# Patient Record
Sex: Female | Born: 1935 | Race: White | Hispanic: No | Smoking: Former smoker
Health system: Southern US, Community
[De-identification: ages and names within clinical notes are randomized; demographics above are authoritative.]

## PROBLEM LIST (undated history)

## (undated) DIAGNOSIS — K219 Gastro-esophageal reflux disease without esophagitis: Secondary | ICD-10-CM

## (undated) DIAGNOSIS — M810 Age-related osteoporosis without current pathological fracture: Secondary | ICD-10-CM

## (undated) DIAGNOSIS — E785 Hyperlipidemia, unspecified: Secondary | ICD-10-CM

## (undated) DIAGNOSIS — I10 Essential (primary) hypertension: Secondary | ICD-10-CM

## (undated) HISTORY — PX: TONSILLECTOMY AND ADENOIDECTOMY: SHX28

## (undated) HISTORY — DX: Gastro-esophageal reflux disease without esophagitis: K21.9

## (undated) HISTORY — PX: LAMINECTOMY: SHX219

## (undated) HISTORY — DX: Hyperlipidemia, unspecified: E78.5

## (undated) HISTORY — PX: BACK SURGERY: SHX140

## (undated) HISTORY — DX: Essential (primary) hypertension: I10

## (undated) HISTORY — DX: Age-related osteoporosis without current pathological fracture: M81.0

---

## 2002-07-27 LAB — HM COLONOSCOPY

## 2005-01-01 ENCOUNTER — Encounter: Admission: RE | Admit: 2005-01-01 | Discharge: 2005-01-01 | Payer: Self-pay | Admitting: Specialist

## 2005-02-06 ENCOUNTER — Ambulatory Visit: Payer: Self-pay | Admitting: Family Medicine

## 2005-08-16 ENCOUNTER — Inpatient Hospital Stay (HOSPITAL_COMMUNITY): Admission: RE | Admit: 2005-08-16 | Discharge: 2005-08-22 | Payer: Self-pay | Admitting: Orthopaedic Surgery

## 2005-08-17 ENCOUNTER — Ambulatory Visit: Payer: Self-pay | Admitting: Physical Medicine & Rehabilitation

## 2005-08-22 ENCOUNTER — Inpatient Hospital Stay (HOSPITAL_COMMUNITY)
Admission: RE | Admit: 2005-08-22 | Discharge: 2005-09-03 | Payer: Self-pay | Admitting: Physical Medicine & Rehabilitation

## 2010-04-09 HISTORY — PX: CORONARY ARTERY BYPASS GRAFT: SHX141

## 2010-04-20 ENCOUNTER — Inpatient Hospital Stay: Payer: Self-pay | Admitting: Internal Medicine

## 2010-06-12 ENCOUNTER — Encounter: Payer: Self-pay | Admitting: Cardiology

## 2010-07-09 ENCOUNTER — Encounter: Payer: Self-pay | Admitting: Cardiology

## 2010-08-08 ENCOUNTER — Encounter: Payer: Self-pay | Admitting: Cardiology

## 2012-02-04 IMAGING — CR DG CHEST 1V PORT
1 series · 1 of 1 positions shown · non-contrast
Comparison: none

REASON FOR EXAM: CP
COMMENTS:

PROCEDURE:     DXR - DXR PORTABLE CHEST SINGLE VIEW  - April 20, 2010  [DATE]
RESULT:     Comparison: None.

[view not recorded]
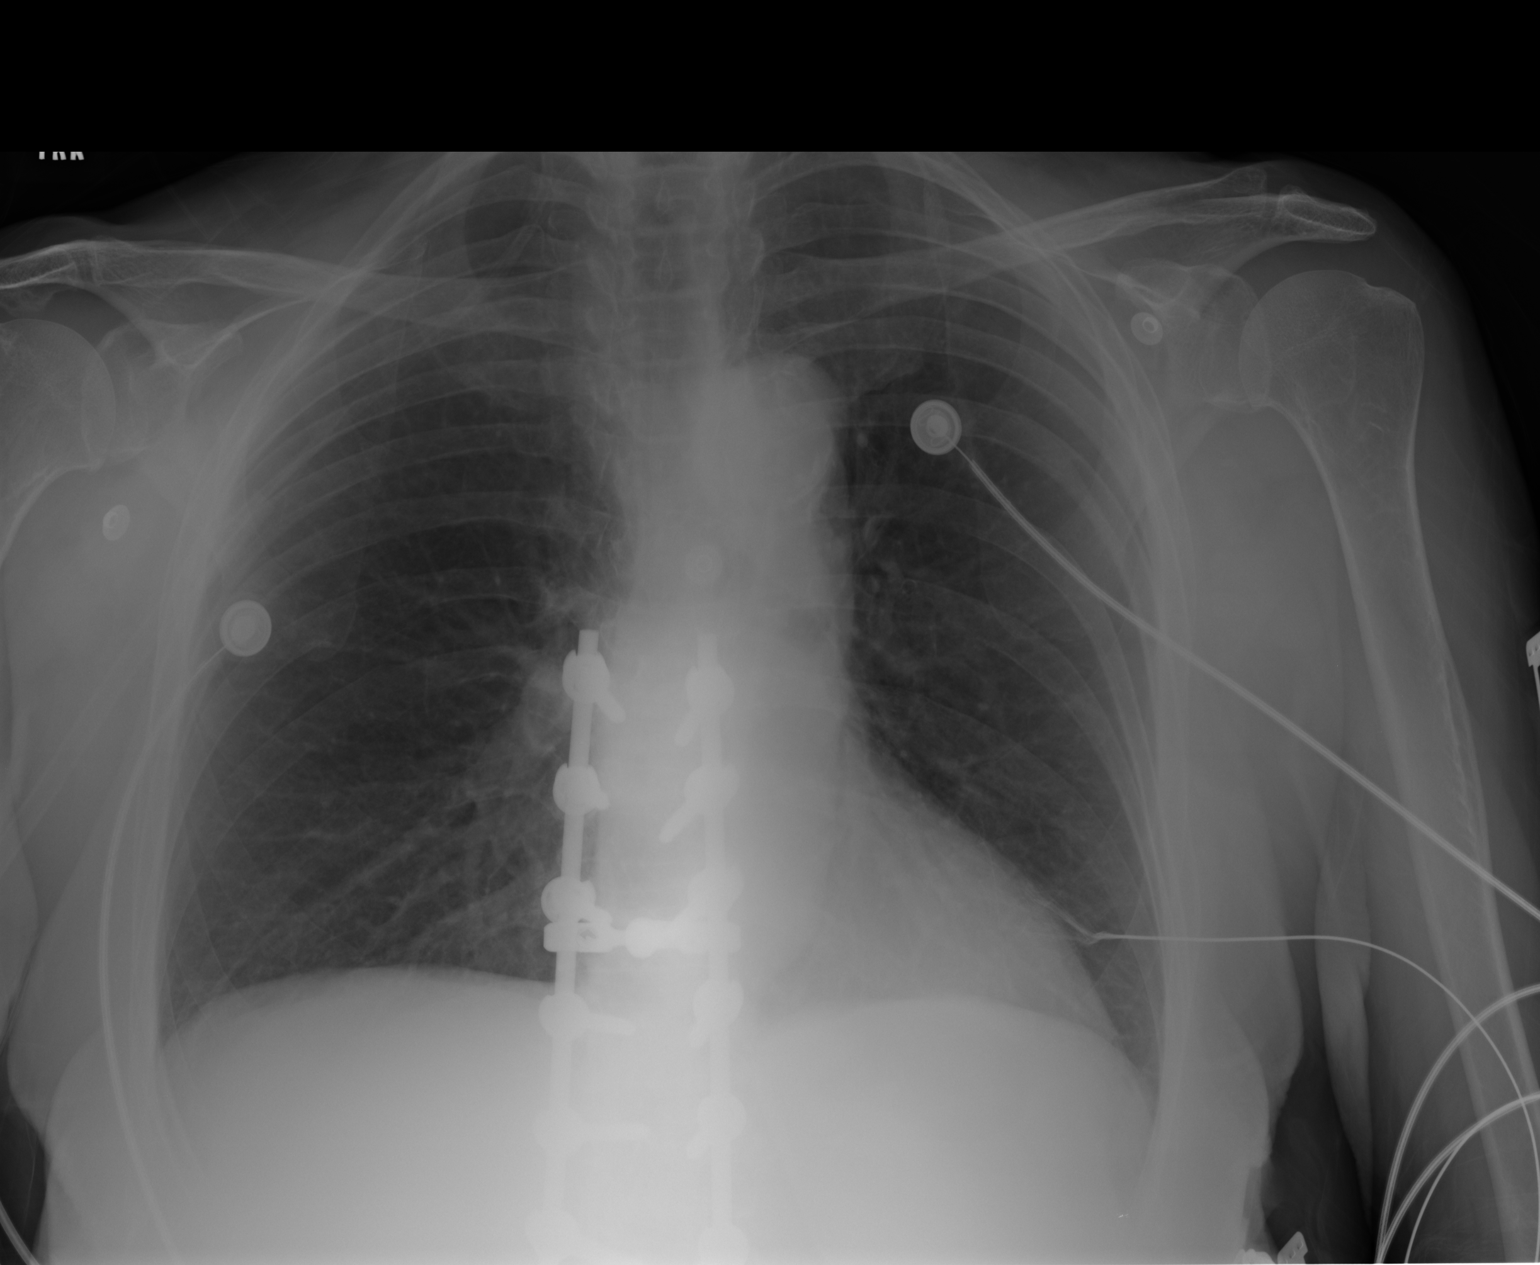

[1 of 1 positions shown; findings below may reference images not displayed]

FINDINGS: The heart size upper limits normal. Aorta mildly tortuous. No focal
parenchymal opacities. There is posterior spinal fusion hardware,
incompletely visualized.
IMPRESSION: No acute cardiopulmonary disease.

## 2014-09-10 ENCOUNTER — Telehealth: Payer: Self-pay | Admitting: Family Medicine

## 2014-09-10 NOTE — Telephone Encounter (Signed)
Scheduled pt for 10:45 on Tuesday 10/12/14. Thanks TNP

## 2014-09-10 NOTE — Telephone Encounter (Signed)
Pt would like to be reestablished because her cardio doctor said he no longer needed to continue seeing pt she could return to PCP. Metoprolol 50 mg twice a day &  Lisinopril 5 mg. Insurance: Social research officer, governmentMedicare & BCBS. Can we reestablish pt and when can I schedule pt? Next available CPE Betsey HolidaySlot is in September. Thanks TNP

## 2014-09-10 NOTE — Telephone Encounter (Signed)
OK to work in 45 minute slot somewhere.  Thanks.

## 2014-09-22 DIAGNOSIS — I10 Essential (primary) hypertension: Secondary | ICD-10-CM | POA: Insufficient documentation

## 2014-09-22 DIAGNOSIS — E7800 Pure hypercholesterolemia, unspecified: Secondary | ICD-10-CM | POA: Insufficient documentation

## 2014-09-22 DIAGNOSIS — I454 Nonspecific intraventricular block: Secondary | ICD-10-CM | POA: Insufficient documentation

## 2014-09-22 DIAGNOSIS — K219 Gastro-esophageal reflux disease without esophagitis: Secondary | ICD-10-CM | POA: Insufficient documentation

## 2014-09-22 DIAGNOSIS — K5730 Diverticulosis of large intestine without perforation or abscess without bleeding: Secondary | ICD-10-CM | POA: Insufficient documentation

## 2014-09-22 DIAGNOSIS — M810 Age-related osteoporosis without current pathological fracture: Secondary | ICD-10-CM | POA: Insufficient documentation

## 2014-09-22 DIAGNOSIS — R9431 Abnormal electrocardiogram [ECG] [EKG]: Secondary | ICD-10-CM | POA: Insufficient documentation

## 2014-10-12 ENCOUNTER — Other Ambulatory Visit: Payer: Self-pay

## 2014-10-12 ENCOUNTER — Ambulatory Visit (INDEPENDENT_AMBULATORY_CARE_PROVIDER_SITE_OTHER): Payer: Medicare Other | Admitting: Family Medicine

## 2014-10-12 ENCOUNTER — Encounter: Payer: Self-pay | Admitting: Family Medicine

## 2014-10-12 VITALS — BP 152/80 | HR 76 | Temp 97.9°F | Resp 16 | Ht 63.0 in | Wt 162.0 lb

## 2014-10-12 DIAGNOSIS — M545 Low back pain, unspecified: Secondary | ICD-10-CM | POA: Insufficient documentation

## 2014-10-12 DIAGNOSIS — M5442 Lumbago with sciatica, left side: Secondary | ICD-10-CM | POA: Diagnosis not present

## 2014-10-12 DIAGNOSIS — E785 Hyperlipidemia, unspecified: Secondary | ICD-10-CM

## 2014-10-12 DIAGNOSIS — Z23 Encounter for immunization: Secondary | ICD-10-CM | POA: Diagnosis not present

## 2014-10-12 DIAGNOSIS — I10 Essential (primary) hypertension: Secondary | ICD-10-CM

## 2014-10-12 DIAGNOSIS — Z789 Other specified health status: Secondary | ICD-10-CM | POA: Insufficient documentation

## 2014-10-12 DIAGNOSIS — I2510 Atherosclerotic heart disease of native coronary artery without angina pectoris: Secondary | ICD-10-CM | POA: Insufficient documentation

## 2014-10-12 DIAGNOSIS — M5441 Lumbago with sciatica, right side: Secondary | ICD-10-CM

## 2014-10-12 DIAGNOSIS — M810 Age-related osteoporosis without current pathological fracture: Secondary | ICD-10-CM | POA: Diagnosis not present

## 2014-10-12 NOTE — Progress Notes (Signed)
Subjective:    Patient ID: Jenny Hill, female    DOB: 30-Mar-1935, 80 y.o.   MRN: [id number]  Hypertension This is a chronic problem. The problem is unchanged. The problem is controlled. Associated symptoms include headaches, malaise/fatigue, palpitations (occasionally), peripheral edema (secondary to CABG) and shortness of breath. Pertinent negatives include no anxiety, blurred vision, chest pain, neck pain, orthopnea or sweats. Treatments tried: Lisinopril, Metoprolol. The current treatment provides significant improvement.  Hyperlipidemia Associated symptoms include myalgias and shortness of breath. Pertinent negatives include no chest pain, focal sensory loss, focal weakness or leg pain. Current antihyperlipidemic treatment includes statins (Atorvastatin).     Review of Systems  Constitutional: Positive for malaise/fatigue.  Eyes: Negative for blurred vision.  Respiratory: Positive for shortness of breath.   Cardiovascular: Positive for palpitations (occasionally). Negative for chest pain and orthopnea.  Musculoskeletal: Positive for myalgias. Negative for neck pain.  Neurological: Positive for headaches. Negative for focal weakness.   BP 152/80 mmHg  Pulse 76  Temp(Src) 97.9 F (36.6 C) (Oral)  Resp 16  Ht 5\' 3"  (1.6 m)  Wt 162 lb (73.483 kg)  BMI 28.70 kg/m2   Patient Active Problem List   Diagnosis Date Noted  . Arteriosclerosis of coronary artery 10/12/2014  . HLD (hyperlipidemia) 10/12/2014  . Drug intolerance 10/12/2014  . Abnormal electrocardiogram 09/22/2014  . Bundle branch block 09/22/2014  . Acid reflux 09/22/2014  . Diverticulosis of colon 09/22/2014  . Pure hypercholesterolemia 09/22/2014  . BP (high blood pressure) 09/22/2014  . OP (osteoporosis) 09/22/2014   Past Medical History  Diagnosis Date  . Hypertension   . Hyperlipidemia   . GERD (gastroesophageal reflux disease)   . Osteoporosis    Current Outpatient Prescriptions on File Prior  to Visit  Medication Sig  . aspirin 81 MG tablet Take by mouth.  Marland Kitchen. BIOFLAVONOID PRODUCTS PO Take by mouth.  . Calcium Carbonate-Vitamin D (CALTRATE 600+D) 600-400 MG-UNIT per tablet Take by mouth.  . metoprolol tartrate (LOPRESSOR) 25 MG tablet Take by mouth.  . Niacin, Antihyperlipidemic, 500 MG TABS Take by mouth.  . lansoprazole (PREVACID) 30 MG capsule Take by mouth.  . Plant Sterols and Stanols 450 MG CAPS Take by mouth.  . Probiotic CAPS Take by mouth.   No current facility-administered medications on file prior to visit.   No Known Allergies Past Surgical History  Procedure Laterality Date  . Laminectomy      C3-S1  . Tonsillectomy and adenoidectomy      childhood  . Back surgery      T10-S1  . Coronary artery bypass graft  2012    triple   History   Social History  . Marital Status: Widowed    Spouse Name: N/A  . Number of Children: N/A  . Years of Education: N/A   Occupational History  . Not on file.   Social History Main Topics  . Smoking status: Former Smoker -- 1.00 packs/day for 52 years    Quit date: 04/08/2000  . Smokeless tobacco: Never Used  . Alcohol Use: No  . Drug Use: No  . Sexual Activity: Not on file   Other Topics Concern  . Not on file   Social History Narrative   Family History  Problem Relation Age of Onset  . Heart disease Brother 3358    3 MI        Objective:   Physical Exam  Constitutional: She is oriented to person, place, and time. She appears well-developed and well-nourished.  Cardiovascular: Normal rate and regular rhythm.   Pulmonary/Chest: Effort normal and breath sounds normal.  Neurological: She is alert and oriented to person, place, and time.  Psychiatric: She has a normal mood and affect. Her behavior is normal. Judgment and thought content normal.   BP 152/80 mmHg  Pulse 76  Temp(Src) 97.9 F (36.6 C) (Oral)  Resp 16  Ht  (1.6 m)  Wt 162 lb (73.483 kg)  BMI 28.70 kg/m2     Assessment & Plan:   1.  HLD (hyperlipidemia) Stable. Continue current medication and plan of care. Will check labs. Has CAD and has not been back to see cardiology. Also talked about her living will and gave her [employer] DNR, with no expiration date for her refrigerator. Patent clear about what it means. Does not want CPR or shocks.  - Lipid panel - Comprehensive metabolic panel  2. OP (osteoporosis) Will check Vitamin D.  - Vit D  25 hydroxy (rtn osteoporosis monitoring)  3. Essential hypertension Stable. Continue current medication.  - CBC with Differential/Platelet - TSH  4. Bilateral low back pain with sciatica, sciatica laterality unspecified Worsening. Will refer back to PT to evaluate and treat.  - Ambulatory referral to Physical Therapy  Lorie Phenix, MD

## 2014-10-13 ENCOUNTER — Telehealth: Payer: Self-pay

## 2014-10-13 LAB — CBC WITH DIFFERENTIAL/PLATELET
BASOS ABS: 0.1 10*3/uL (ref 0.0–0.2)
BASOS: 1 %
EOS (ABSOLUTE): 0.1 10*3/uL (ref 0.0–0.4)
Eos: 1 %
HEMATOCRIT: 37.9 % (ref 34.0–46.6)
HEMOGLOBIN: 12.2 g/dL (ref 11.1–15.9)
IMMATURE GRANS (ABS): 0 10*3/uL (ref 0.0–0.1)
Immature Granulocytes: 0 %
LYMPHS: 34 %
Lymphocytes Absolute: 2.1 10*3/uL (ref 0.7–3.1)
MCH: 27.9 pg (ref 26.6–33.0)
MCHC: 32.2 g/dL (ref 31.5–35.7)
MCV: 87 fL (ref 79–97)
MONOCYTES: 8 %
Monocytes Absolute: 0.5 10*3/uL (ref 0.1–0.9)
NEUTROS ABS: 3.5 10*3/uL (ref 1.4–7.0)
NEUTROS PCT: 56 %
Platelets: 257 10*3/uL (ref 150–379)
RBC: 4.37 x10E6/uL (ref 3.77–5.28)
RDW: 13.9 % (ref 12.3–15.4)
WBC: 6.2 10*3/uL (ref 3.4–10.8)

## 2014-10-13 LAB — COMPREHENSIVE METABOLIC PANEL
A/G RATIO: 1.6 (ref 1.1–2.5)
ALK PHOS: 69 IU/L (ref 39–117)
ALT: 15 IU/L (ref 0–32)
AST: 25 IU/L (ref 0–40)
Albumin: 4.7 g/dL (ref 3.5–4.8)
BILIRUBIN TOTAL: 0.9 mg/dL (ref 0.0–1.2)
BUN / CREAT RATIO: 16 (ref 11–26)
BUN: 16 mg/dL (ref 8–27)
CHLORIDE: 103 mmol/L (ref 97–108)
CO2: 19 mmol/L (ref 18–29)
Calcium: 9.9 mg/dL (ref 8.7–10.3)
Creatinine, Ser: 0.99 mg/dL (ref 0.57–1.00)
GFR, EST AFRICAN AMERICAN: 63 mL/min/{1.73_m2} (ref >59)
GFR, EST NON AFRICAN AMERICAN: 55 mL/min/{1.73_m2} — AB (ref >59)
Globulin, Total: 3 g/dL (ref 1.5–4.5)
Glucose: 99 mg/dL (ref 65–99)
POTASSIUM: 4.4 mmol/L (ref 3.5–5.2)
SODIUM: 144 mmol/L (ref 134–144)
TOTAL PROTEIN: 7.7 g/dL (ref 6.0–8.5)

## 2014-10-13 LAB — LIPID PANEL
CHOL/HDL RATIO: 3.3 ratio (ref 0.0–4.4)
Cholesterol, Total: 221 mg/dL — ABNORMAL HIGH (ref 100–199)
HDL: 66 mg/dL (ref >39)
LDL CALC: 99 mg/dL (ref 0–99)
Triglycerides: 281 mg/dL — ABNORMAL HIGH (ref 0–149)
VLDL Cholesterol Cal: 56 mg/dL — ABNORMAL HIGH (ref 5–40)

## 2014-10-13 LAB — TSH: TSH: 1.25 u[IU]/mL (ref 0.450–4.500)

## 2014-10-13 LAB — VITAMIN D 25 HYDROXY (VIT D DEFICIENCY, FRACTURES): VIT D 25 HYDROXY: 41.5 ng/mL (ref 30.0–100.0)

## 2014-10-13 NOTE — Telephone Encounter (Signed)
LMTCB Emily Drozdowski, CMA  

## 2014-10-13 NOTE — Telephone Encounter (Signed)
-----   Message from Lorie PhenixNancy Maloney, MD sent at 10/13/2014  4:04 PM EDT ----- Labs stable. Please notify patient. Thanks.

## 2014-10-14 NOTE — Telephone Encounter (Signed)
Advised pt of lab results. Pt verbally acknowledges understanding. Emily Drozdowski, CMA   

## 2014-10-26 ENCOUNTER — Ambulatory Visit: Payer: Medicare Other | Attending: Family Medicine | Admitting: Physical Therapy

## 2014-10-26 ENCOUNTER — Encounter: Payer: Self-pay | Admitting: Physical Therapy

## 2014-10-26 DIAGNOSIS — R531 Weakness: Secondary | ICD-10-CM | POA: Insufficient documentation

## 2014-10-26 DIAGNOSIS — R262 Difficulty in walking, not elsewhere classified: Secondary | ICD-10-CM | POA: Diagnosis not present

## 2014-10-26 NOTE — Therapy (Signed)
[place] [facility] MAIN [facility] [address] [place], [place], [address] Phone: [phone]   Fax:  [phone]  Physical Therapy Evaluation  Patient Details  Name: Jenny RakeClementine L Hill MRN: [id number] Date of Birth: 1934/06/06 Referring Provider:  Lorie PhenixMaloney, Nancy, MD  Encounter Date: 10/26/2014      PT End of Session - 10/26/14 1708    Visit Number 1   Number of Visits 13   PT Start Time 0430   PT Stop Time 0530   PT Time Calculation (min) 60 min   Equipment Utilized During Treatment Gait belt   Activity Tolerance Patient tolerated treatment well   Behavior During Therapy [facility] for tasks assessed/performed      Past Medical History  Diagnosis Date  . Hypertension   . Hyperlipidemia   . GERD (gastroesophageal reflux disease)   . Osteoporosis     Past Surgical History  Procedure Laterality Date  . Laminectomy      C3-S1  . Tonsillectomy and adenoidectomy      childhood  . Back surgery      T10-S1  . Coronary artery bypass graft  2012    triple    There were no vitals filed for this visit.  Visit Diagnosis:  Weakness - Plan: PT plan of care cert/re-cert  Difficulty walking - Plan: PT plan of care cert/re-cert      Subjective Assessment - 10/26/14 1640    Subjective Patient wants to get her core stronger.    Currently in Pain? No/denies            [facility] - 10/26/14 1640    Assessment   Medical Diagnosis LBP   Onset Date/Surgical Date 08/07/05   Hand Dominance Right   Next MD Visit 01/08/15   Prior Therapy yes   Balance Screen   Has the patient fallen in the past 6 months Yes   How many times? 2   Has the patient had a decrease in activity level because of a fear of falling?  Yes   Is the patient reluctant to leave their home because of a fear of falling?  No   Home Nurse, mental healthnvironment   Living Environment Private residence   Living Arrangements Alone   Available Help at Discharge Neighbor   Type of  Home House   Home Access Stairs to enter   Entrance Stairs-Number of Steps 2   Entrance Stairs-Rails Right   Home Layout One level   Home Equipment Walker - 4 wheels;Cane - single point;Grab bars - tub/shower;Tub bench   Prior Function   Level of Independence Independent       Eval: Sensation is intact Strength BUE WNL Strength BLE hip flex 3/5, abd 2+/5, extension 2+/5 , knee flex and ext 5/5, ankle WNL Ambulation with RW outdoors, using UE and furniture indoors, flexed trunk Outcome measures: 5 x sit to stand 26.12 sec TUG 18.62 sec 10 MW .66 m/sec 6 MW 450 feet   Therapeutic exericses: Hooklying TA with hip abd/ER x 20 GTB Hooklying hip flex/ext x 10 Hooklying marching with TA x 20  Leg press x 75 lbs x 20 x 2                        PT Long Term Goals - 10/26/14 1712    PT LONG TERM GOAL #1   Title Patient will reduce timed up and go to <11 seconds to reduce fall risk and demonstrate  improved transfer/gait ability10/11/16   PT LONG TERM GOAL #2   Title Patient will increase six minute walk test distance to >1000 for progression to community ambulator and improve gait ability10/11/16   PT LONG TERM GOAL #3   Title Patient (> 105 years old) will complete five times sit to stand test in < 15 seconds indicating an increased LE strength and improved balance10/11/16               Plan - 11-01-2014 1708    Clinical Impression Statement Patient is 80 year old female with weakness in BLE hips and core and decreased ambulation. She has decreased gait speed and increased fall risk.    Pt will benefit from skilled therapeutic intervention in order to improve on the following deficits Abnormal gait;Decreased strength;Decreased mobility;Difficulty walking   Rehab Potential Fair   PT Frequency 1x / week   PT Duration 12 weeks   PT Treatment/Interventions Aquatic Therapy;Cryotherapy;Electrical Stimulation;Moist Heat;Balance training;Therapeutic  exercise;Therapeutic activities;Gait training   PT Next Visit Plan strengthening progression   Consulted and Agree with Plan of Care Patient          G-Codes - 01-Nov-2014 1734    Functional Assessment Tool Used TUG, 5 x sit to stand , 6 MW 10 MW   Functional Limitation Mobility: Walking and moving around   Mobility: Walking and Moving Around Current Status [phone]) At least 40 percent but less than 60 percent impaired, limited or restricted   Mobility: Walking and Moving Around Goal Status [phone]) At least 20 percent but less than 40 percent impaired, limited or restricted       Problem List Patient Active Problem List   Diagnosis Date Noted  . Arteriosclerosis of coronary artery 10/12/2014  . HLD (hyperlipidemia) 10/12/2014  . Drug intolerance 10/12/2014  . Low back pain 10/12/2014  . Abnormal electrocardiogram 09/22/2014  . Bundle branch block 09/22/2014  . Acid reflux 09/22/2014  . Diverticulosis of colon 09/22/2014  . Pure hypercholesterolemia 09/22/2014  . BP (high blood pressure) 09/22/2014  . OP (osteoporosis) 09/22/2014    [name] Nov 01, 2014, 5:35 PM  [place] [facility] MAIN [facility] SERVICES [address] [place], [place], [address] Phone: [phone]   Fax:  [phone]

## 2014-11-01 ENCOUNTER — Ambulatory Visit: Payer: Medicare Other | Admitting: Physical Therapy

## 2014-11-01 DIAGNOSIS — R531 Weakness: Secondary | ICD-10-CM

## 2014-11-01 DIAGNOSIS — R262 Difficulty in walking, not elsewhere classified: Secondary | ICD-10-CM

## 2014-11-01 NOTE — Therapy (Signed)
[place] [facility] MAIN [facility] SERVICES [address] [place], [place], [address] Phone: [phone]   Fax:  [phone]  Physical Therapy Treatment  Patient Details  Name: Jenny Hill MRN: [id number] Date of Birth: 1934-07-30 Referring Provider:  Lorie Phenix, MD  Encounter Date: 11/01/2014    Past Medical History  Diagnosis Date  . Hypertension   . Hyperlipidemia   . GERD (gastroesophageal reflux disease)   . Osteoporosis     Past Surgical History  Procedure Laterality Date  . Laminectomy      C3-S1  . Tonsillectomy and adenoidectomy      childhood  . Back surgery      T10-S1  . Coronary artery bypass graft  2012    triple    There were no vitals filed for this visit.  Visit Diagnosis:  No diagnosis found.      Subjective Assessment - 11/01/14 1343    Subjective Pt reports she has been compliant with her HEP.  She states that her R LE is more weak than her L LE, but she is uncertain why.   Currently in Pain? No/denies       Therapeutic Exercises performed as follows: Seated alt. Hip Flexion with 2# ankle wts 2x10; LAQs with 2# B 2x10; HS curls with green Tband 2x10 B; Hooklying hip abd/ER with green tband and TA squeeze 2x10; Bridging with TA squeeze 2x10; SL'ing clams B with 2# on lateral hips 2x10; stdg heel/toe raises 2x10. Leg Press Machine 20x 75# leg press and 20x heel raises with 75#. Sit to Stands from large blue plinth with no hands 2x10. Ambulation on TM at 0.8 mph x 2 min with verbal cueing to not drag R foot and for proper upright posture with ambulation.       Assessment:  Pt exhibited significant core/trunk weakness with bridging exercise today.  She also exhibited R LE weakness with gait and standing ther ex's.      Plan:  Continue to work on core and LE strengthening as tolerated at next visit.                PT Long Term Goals - 10/26/14 1712    PT LONG TERM GOAL #1   Title  Patient will reduce timed up and go to <11 seconds to reduce fall risk and demonstrate improved transfer/gait ability10/11/16   PT LONG TERM GOAL #2   Title Patient will increase six minute walk test distance to >1000 for progression to community ambulator and improve gait ability10/11/16   PT LONG TERM GOAL #3   Title Patient (> 47 years old) will complete five times sit to stand test in < 15 seconds indicating an increased LE strength and improved balance10/11/16               Problem List Patient Active Problem List   Diagnosis Date Noted  . Arteriosclerosis of coronary artery 10/12/2014  . HLD (hyperlipidemia) 10/12/2014  . Drug intolerance 10/12/2014  . Low back pain 10/12/2014  . Abnormal electrocardiogram 09/22/2014  . Bundle branch block 09/22/2014  . Acid reflux 09/22/2014  . Diverticulosis of colon 09/22/2014  . Pure hypercholesterolemia 09/22/2014  . BP (high blood pressure) 09/22/2014  . OP (osteoporosis) 09/22/2014    Michalene Debruler, MPT 11/01/2014, 1:44 PM  [place] [facility] MAIN [facility] SERVICES [address] [place], [place], [address] Phone: [phone]   Fax:  [phone]

## 2014-11-03 ENCOUNTER — Encounter: Payer: Medicare Other | Admitting: Physical Therapy

## 2014-11-08 ENCOUNTER — Encounter: Payer: Self-pay | Admitting: Physical Therapy

## 2014-11-08 ENCOUNTER — Ambulatory Visit: Payer: Medicare Other | Attending: Family Medicine | Admitting: Physical Therapy

## 2014-11-08 DIAGNOSIS — R262 Difficulty in walking, not elsewhere classified: Secondary | ICD-10-CM | POA: Diagnosis present

## 2014-11-08 DIAGNOSIS — R531 Weakness: Secondary | ICD-10-CM | POA: Insufficient documentation

## 2014-11-08 NOTE — Therapy (Signed)
[place] [facility] MAIN [facility] SERVICES [address] [place], [place], [address] Phone: [phone]   Fax:  [phone]  Physical Therapy Treatment  Patient Details  Name: Jenny Hill MRN: [id number] Date of Birth: 1934/10/20 Referring Provider:  Lorie Phenix, MD  Encounter Date: 11/08/2014      PT End of Session - 11/08/14 1309    Visit Number 2   Number of Visits 13   PT Start Time 0100   PT Stop Time 0145   PT Time Calculation (min) 45 min   Equipment Utilized During Treatment Gait belt   Activity Tolerance Patient tolerated treatment well   Behavior During Therapy [facility] for tasks assessed/performed      Past Medical History  Diagnosis Date  . Hypertension   . Hyperlipidemia   . GERD (gastroesophageal reflux disease)   . Osteoporosis     Past Surgical History  Procedure Laterality Date  . Laminectomy      C3-S1  . Tonsillectomy and adenoidectomy      childhood  . Back surgery      T10-S1  . Coronary artery bypass graft  2012    triple    There were no vitals filed for this visit.  Visit Diagnosis:  Weakness  Difficulty walking      Subjective Assessment - 11/08/14 1308    Subjective Pt reports she has been compliant with her HEP.  She states that her R LE is more weak than her L LE, but she is uncertain why.She is having more discomfort now that she is doing her HEP.       Therapeutic exericses: Hooklying TA with hip abd/ER x 20 GTB Hooklying hip flex/ext x 10 Hooklying marching with TA x 20  Leg press x 75 lbs x 20 x 2 4 way with YTB   Muscle fatigue but no major pain complaints. Patient advancing to red theraband for exercises listed above.                          PT Education - 11/08/14 1309    Education provided Yes   Education Details HEP review   Person(s) Educated Patient   Methods Explanation   Comprehension Verbalized understanding             PT Long Term Goals  - 10/26/14 1712    PT LONG TERM GOAL #1   Title Patient will reduce timed up and go to <11 seconds to reduce fall risk and demonstrate improved transfer/gait ability10/11/16   PT LONG TERM GOAL #2   Title Patient will increase six minute walk test distance to >1000 for progression to community ambulator and improve gait ability10/11/16   PT LONG TERM GOAL #3   Title Patient (> 40 years old) will complete five times sit to stand test in < 15 seconds indicating an increased LE strength and improved balance10/11/16               Plan - 11/08/14 1310    Clinical Impression Statement Patient was seen for sthernthening and balance training. She is able to perform all exercises with no reports of pain.    Pt will benefit from skilled therapeutic intervention in order to improve on the following deficits Abnormal gait;Decreased strength;Decreased mobility;Difficulty walking   Rehab Potential Fair   PT Frequency 1x / week   PT Duration 12 weeks   PT Treatment/Interventions Aquatic Therapy;Cryotherapy;Electrical Stimulation;Moist Heat;Balance training;Therapeutic exercise;Therapeutic activities;Gait training  PT Next Visit Plan strengthening progression   Consulted and Agree with Plan of Care Patient        Problem List Patient Active Problem List   Diagnosis Date Noted  . Arteriosclerosis of coronary artery 10/12/2014  . HLD (hyperlipidemia) 10/12/2014  . Drug intolerance 10/12/2014  . Low back pain 10/12/2014  . Abnormal electrocardiogram 09/22/2014  . Bundle branch block 09/22/2014  . Acid reflux 09/22/2014  . Diverticulosis of colon 09/22/2014  . Pure hypercholesterolemia 09/22/2014  . BP (high blood pressure) 09/22/2014  . OP (osteoporosis) 09/22/2014    [name] 11/08/2014, 1:12 PM  [place] [facility] MAIN [facility] SERVICES [address] [place], [place], [address] Phone: [phone]   Fax:  [phone]

## 2014-11-10 ENCOUNTER — Encounter: Payer: Medicare Other | Admitting: Physical Therapy

## 2014-11-15 ENCOUNTER — Encounter: Payer: Medicare Other | Admitting: Physical Therapy

## 2014-11-18 ENCOUNTER — Ambulatory Visit: Payer: Medicare Other | Admitting: Physical Therapy

## 2014-11-22 ENCOUNTER — Ambulatory Visit: Payer: Medicare Other | Admitting: Physical Therapy

## 2014-11-22 DIAGNOSIS — R262 Difficulty in walking, not elsewhere classified: Secondary | ICD-10-CM

## 2014-11-22 DIAGNOSIS — R531 Weakness: Secondary | ICD-10-CM | POA: Diagnosis not present

## 2014-11-22 NOTE — Therapy (Signed)
[place] [facility] MAIN [facility] SERVICES [address] [place], [place], [address] Phone: [phone]   Fax:  [phone]  Physical Therapy Treatment  Patient Details  Name: Jenny Hill MRN: [id number] Date of Birth: 1934/05/18 Referring Provider:  Lorie Phenix, MD  Encounter Date: 11/22/2014      PT End of Session - 11/22/14 1337    Visit Number 3   Number of Visits 13   PT Start Time 0100   PT Stop Time 0145   PT Time Calculation (min) 45 min   Equipment Utilized During Treatment Gait belt   Activity Tolerance Patient tolerated treatment well   Behavior During Therapy [facility] for tasks assessed/performed      Past Medical History  Diagnosis Date  . Hypertension   . Hyperlipidemia   . GERD (gastroesophageal reflux disease)   . Osteoporosis     Past Surgical History  Procedure Laterality Date  . Laminectomy      C3-S1  . Tonsillectomy and adenoidectomy      childhood  . Back surgery      T10-S1  . Coronary artery bypass graft  2012    triple    There were no vitals filed for this visit.  Visit Diagnosis:  Weakness  Difficulty walking      Subjective Assessment - 11/22/14 1330    Subjective Patient says that she had a bad back spasm and now she is getting better. She is better now.         Therapeutic exercise including; Hooklying abd/ER x 20 with TA Hooklying heel slides with TA Hooklying marching with TA Prone Y, I T's Standing scapula retraction isometrics against the wall Leg press 90 lbs x 20 x 2 and heel raises 90 lbs 20 x 2 No reports of pain following therapy                         PT Education - 11/22/14 1332    Education provided Yes   Person(s) Educated Patient   Methods Explanation   Comprehension Verbalized understanding             PT Long Term Goals - 10/26/14 1712    PT LONG TERM GOAL #1   Title Patient will reduce timed up and go to <11 seconds to reduce fall  risk and demonstrate improved transfer/gait ability10/11/16   PT LONG TERM GOAL #2   Title Patient will increase six minute walk test distance to >1000 for progression to community ambulator and improve gait ability10/11/16   PT LONG TERM GOAL #3   Title Patient (> 78 years old) will complete five times sit to stand test in < 15 seconds indicating an increased LE strength and improved balance10/11/16               Plan - 11/22/14 1338    Clinical Impression Statement Patient was educated and reinstructed in HEP and progressed thoracic strengthening for HEP.    Pt will benefit from skilled therapeutic intervention in order to improve on the following deficits Abnormal gait;Decreased strength;Decreased mobility;Difficulty walking   Rehab Potential Fair   PT Frequency 1x / week   PT Duration 12 weeks   PT Treatment/Interventions Aquatic Therapy;Cryotherapy;Electrical Stimulation;Moist Heat;Balance training;Therapeutic exercise;Therapeutic activities;Gait training   PT Next Visit Plan strengthening progression   Consulted and Agree with Plan of Care Patient        Problem List Patient Active Problem List   Diagnosis  Date Noted  . Arteriosclerosis of coronary artery 10/12/2014  . HLD (hyperlipidemia) 10/12/2014  . Drug intolerance 10/12/2014  . Low back pain 10/12/2014  . Abnormal electrocardiogram 09/22/2014  . Bundle branch block 09/22/2014  . Acid reflux 09/22/2014  . Diverticulosis of colon 09/22/2014  . Pure hypercholesterolemia 09/22/2014  . BP (high blood pressure) 09/22/2014  . OP (osteoporosis) 09/22/2014    Jenny Hill 11/22/2014, 1:40 PM  [place] [facility] MAIN [facility] SERVICES [address] [place], [place], [address] Phone: [phone]   Fax:  [phone]

## 2014-11-29 ENCOUNTER — Encounter: Payer: Medicare Other | Admitting: Physical Therapy

## 2014-12-06 ENCOUNTER — Encounter: Payer: Medicare Other | Admitting: Physical Therapy

## 2014-12-14 ENCOUNTER — Encounter: Payer: Medicare Other | Admitting: Physical Therapy

## 2014-12-20 ENCOUNTER — Other Ambulatory Visit: Payer: Self-pay

## 2014-12-20 ENCOUNTER — Encounter: Payer: Medicare Other | Admitting: Physical Therapy

## 2014-12-20 DIAGNOSIS — I10 Essential (primary) hypertension: Secondary | ICD-10-CM

## 2014-12-20 MED ORDER — LISINOPRIL 5 MG PO TABS: 5.0000 mg | ORAL_TABLET | Freq: Every day | ORAL | Status: DC

## 2014-12-20 MED ORDER — METOPROLOL TARTRATE 25 MG PO TABS: 25.0000 mg | ORAL_TABLET | Freq: Two times a day (BID) | ORAL | Status: DC

## 2014-12-27 ENCOUNTER — Encounter: Payer: Medicare Other | Admitting: Physical Therapy

## 2015-01-03 ENCOUNTER — Encounter: Payer: Medicare Other | Admitting: Physical Therapy

## 2015-01-10 ENCOUNTER — Encounter: Payer: Medicare Other | Admitting: Physical Therapy

## 2015-01-17 ENCOUNTER — Encounter: Payer: Medicare Other | Admitting: Physical Therapy

## 2015-01-18 ENCOUNTER — Encounter: Payer: Self-pay | Admitting: Family Medicine

## 2015-01-18 ENCOUNTER — Ambulatory Visit (INDEPENDENT_AMBULATORY_CARE_PROVIDER_SITE_OTHER): Payer: Medicare Other | Admitting: Family Medicine

## 2015-01-18 VITALS — BP 160/90 | HR 72 | Temp 98.0°F | Resp 16 | Wt 163.0 lb

## 2015-01-18 DIAGNOSIS — Z23 Encounter for immunization: Secondary | ICD-10-CM | POA: Diagnosis not present

## 2015-01-18 DIAGNOSIS — Z0000 Encounter for general adult medical examination without abnormal findings: Secondary | ICD-10-CM

## 2015-01-18 NOTE — Progress Notes (Signed)
Patient ID: [name], female   DOB: [birth year], 80 y.o.   MRN: [id number]        Patient: Jenny Hill, Female    DOB: 1934-06-05, 80 y.o.   MRN: [id number] Visit Date: 01/18/2015  Today's Provider: Lorie Phenix, MD   Chief Complaint  Patient presents with  . Medicare Wellness   Subjective:    Annual wellness visit Jenny Hill is a 80 y.o. female. She feels well. She reports exercising daily. She reports she is sleeping well.  Does not want any other screening tests at this time. Does not want mammogram or colonoscopy. Also does not want  Bone density, has osteoporosis and does not want medical treatment for it.    07/27/02 Colonoscopy-Diverticulosis 04/20/10 EKG  Lab Results  Component Value Date   WBC 6.2 10/12/2014   HCT 37.9 10/12/2014   GLUCOSE 99 10/12/2014   CHOL 221* 10/12/2014   TRIG 281* 10/12/2014   HDL 66 10/12/2014   LDLCALC 99 10/12/2014   ALT 15 10/12/2014   AST 25 10/12/2014   NA 144 10/12/2014   K 4.4 10/12/2014   CL 103 10/12/2014   CREATININE 0.99 10/12/2014   BUN 16 10/12/2014   CO2 19 10/12/2014   TSH 1.250 10/12/2014    -----------------------------------------------------------   Review of Systems  Constitutional: Negative.   HENT: Positive for dental problem.   Eyes: Negative.   Respiratory: Negative.   Cardiovascular: Negative.   Gastrointestinal: Negative.   Endocrine: Positive for polyuria.  Genitourinary: Positive for frequency.  Musculoskeletal: Positive for back pain and gait problem.  Skin: Negative.   Allergic/Immunologic: Negative.   Neurological: Negative.   Hematological: Negative.   Psychiatric/Behavioral: Negative.     Social History   Social History  . Marital Status: Widowed    Spouse Name: N/A  . Number of Children: N/A  . Years of Education: N/A   Occupational History  . Not on file.   Social History Main Topics  . Smoking status: Former Smoker -- 1.00 packs/day for 52  years    Quit date: 04/08/2000  . Smokeless tobacco: Never Used  . Alcohol Use: No  . Drug Use: No  . Sexual Activity: Not on file   Other Topics Concern  . Not on file   Social History Narrative    Patient Active Problem List   Diagnosis Date Noted  . Arteriosclerosis of coronary artery 10/12/2014  . HLD (hyperlipidemia) 10/12/2014  . Drug intolerance 10/12/2014  . Low back pain 10/12/2014  . Abnormal electrocardiogram 09/22/2014  . Bundle branch block 09/22/2014  . Acid reflux 09/22/2014  . Diverticulosis of colon 09/22/2014  . Pure hypercholesterolemia 09/22/2014  . BP (high blood pressure) 09/22/2014  . OP (osteoporosis) 09/22/2014    Past Surgical History  Procedure Laterality Date  . Laminectomy      C3-S1  . Tonsillectomy and adenoidectomy      childhood  . Back surgery      T10-S1  . Coronary artery bypass graft  2012    triple    Her family history includes Heart disease (age of onset: 43) in her brother.    Previous Medications   ASA-APAP-CAFF BUFFERED (VANQUISH) [phone] MG TABS    Take 2 tablets by mouth 2 (two) times daily.   ATORVASTATIN (LIPITOR) 10 MG TABLET    TAKE ONE TABLET BY MOUTH AT BEDTIME   CALCIUM CARBONATE-VITAMIN D (CALTRATE 600+D) 600-400 MG-UNIT PER TABLET    Take by mouth.  COENZYME Q10 (COQ10 PO)    Take by mouth.   LISINOPRIL (PRINIVIL,ZESTRIL) 5 MG TABLET    Take 1 tablet (5 mg total) by mouth daily.   METOPROLOL TARTRATE (LOPRESSOR) 25 MG TABLET    Take 1 tablet (25 mg total) by mouth 2 (two) times daily.   MULTIPLE VITAMIN (MULTI-VITAMINS) TABS    Take by mouth.   NIACIN, ANTIHYPERLIPIDEMIC, 500 MG TABS    Take by mouth.    Patient Care Team: Lorie Phenix, MD as PCP - General (Family Medicine)     Objective:   Vitals: BP 160/90 mmHg  Pulse 72  Temp(Src) 98 F (36.7 C)  Resp 16  Ht   Wt 163 lb (73.936 kg)  SpO2 99%  Physical Exam  Constitutional: She is oriented to person, place, and time. She appears  well-developed and well-nourished.  HENT:  Head: Normocephalic and atraumatic.  Right Ear: Tympanic membrane, external ear and ear canal normal.  Left Ear: Tympanic membrane, external ear and ear canal normal.  Nose: Nose normal.  Mouth/Throat: Uvula is midline, oropharynx is clear and moist and mucous membranes are normal.  Eyes: Conjunctivae, EOM and lids are normal. Pupils are equal, round, and reactive to light.  Neck: Trachea normal and normal range of motion. Neck supple. Carotid bruit is not present. No thyroid mass and no thyromegaly present.  Cardiovascular: Normal rate, regular rhythm and normal heart sounds.   Pulmonary/Chest: Effort normal and breath sounds normal.  Abdominal: Soft. Normal appearance and bowel sounds are normal. There is no hepatosplenomegaly. There is no tenderness.  Genitourinary: No breast swelling, tenderness or discharge.  Musculoskeletal: Normal range of motion.  Lymphadenopathy:    She has no cervical adenopathy.    She has no axillary adenopathy.  Neurological: She is alert and oriented to person, place, and time. She has normal strength. No cranial nerve deficit.  Skin: Skin is warm, dry and intact.  Well healed mid-line scar  Psychiatric: She has a normal mood and affect. Her speech is normal and behavior is normal. Judgment and thought content normal. Cognition and memory are normal.    Activities of Daily Living In your present state of health, do you have any difficulty performing the following activities: 01/18/2015  Hearing? N  Vision? N  Difficulty concentrating or making decisions? N  Walking or climbing stairs? N  Dressing or bathing? N  Doing errands, shopping? N    Fall Risk Assessment Fall Risk  01/18/2015 10/12/2014 10/12/2014  Falls in the past year? Yes Yes Yes  Number falls in past yr: 2 or more 2 or more 2 or more  Injury with Fall? No No No  Risk Factor Category  - High Fall Risk -  Risk for fall due to : - History of  fall(s);Impaired balance/gait -  Follow up - Education provided;Falls prevention discussed -     Depression Screen PHQ 2/9 Scores 01/18/2015 10/12/2014 10/12/2014  PHQ - 2 Score 0 0 0    Cognitive Testing - 6-CIT  Correct? Score   What year is it? yes 0 0 or 4  What month is it? yes 0 0 or 3  Memorize:    Floyde Parkins,  44,  [address]  [place],      What time is it? (within 1 hour) yes 0 0 or 3  Count backwards from 20 yes 0 0, 2, or 4  Name the months of the year yes 0 0, 2, or 4  Repeat name &  address above yes 1 0, 2, 4, 6, 8, or 10       TOTAL SCORE  1/28   Interpretation:  Normal  Normal (0-7) Abnormal (8-28)       Assessment & Plan:     Annual Wellness Visit  Reviewed patient's Family Medical History Reviewed and updated list of patient's medical providers Assessment of cognitive impairment was done Assessed patient's functional ability Established a written schedule for health screening services Health Risk Assessent Completed and Reviewed  Exercise Activities and Dietary recommendations Goals    . Exercise 150 minutes per week (moderate activity)       Immunization History  Administered Date(s) Administered  . Influenza, High Dose Seasonal PF 01/18/2015  . Pneumococcal Conjugate-13 10/12/2014  . Pneumococcal Polysaccharide-23 02/02/2005    Health Maintenance  Topic Date Due  . TETANUS/TDAP  08/25/1955  . ZOSTAVAX  08/24/1996  . DEXA SCAN  08/24/2001  . INFLUENZA VACCINE  11/08/2014  . PNA vac Low Risk Adult  Completed       1. Medicare annual wellness visit, subsequent Stable. Patient advised to continue eating healthy and exercise daily.   2. Need for influenza vaccination - Flu vaccine HIGH DOSE PF      Patient seen and examined by Dr. Leo Grosser, and note scribed by Liz Beach. Dimas, CMA.  I have reviewed the document for accuracy and completeness and I agree with above. Leo Grosser, MD   Lorie Phenix, MD     ------------------------------------------------------------------------------------------------------------

## 2015-11-29 ENCOUNTER — Encounter: Payer: Self-pay | Admitting: Physician Assistant

## 2015-11-29 ENCOUNTER — Ambulatory Visit (INDEPENDENT_AMBULATORY_CARE_PROVIDER_SITE_OTHER): Payer: Medicare Other | Admitting: Physician Assistant

## 2015-11-29 VITALS — BP 140/82 | HR 66 | Temp 98.3°F | Resp 16 | Wt 158.0 lb

## 2015-11-29 DIAGNOSIS — I10 Essential (primary) hypertension: Secondary | ICD-10-CM

## 2015-11-29 DIAGNOSIS — E7800 Pure hypercholesterolemia, unspecified: Secondary | ICD-10-CM

## 2015-11-29 MED ORDER — ATORVASTATIN CALCIUM 10 MG PO TABS: 10.0000 mg | ORAL_TABLET | Freq: Every day | ORAL | 1 refills | Status: AC

## 2015-11-29 MED ORDER — METOPROLOL TARTRATE 50 MG PO TABS: 50.0000 mg | ORAL_TABLET | Freq: Two times a day (BID) | ORAL | 1 refills | Status: DC

## 2015-11-29 MED ORDER — LISINOPRIL 5 MG PO TABS: 5.0000 mg | ORAL_TABLET | Freq: Every day | ORAL | 1 refills | Status: DC

## 2015-11-29 NOTE — Progress Notes (Signed)
Patient: Jenny Hill Female    DOB: [birth year]   81 y.o.   MRN: [id number] Visit Date: 11/29/2015  Today's Provider: Margaretann LovelessJennifer M Sheranda Seabrooks, PA-C   Chief Complaint  Patient presents with  . Hyperlipidemia  . Hypertension  . Follow-up   Subjective:    HPI  Patient is actually moving to [place] at the end of September. She is requesting printed prescriptions to take with her until she can establish care there. She does not want labs today.   Hypertension, follow-up:  BP Readings from Last 3 Encounters:  11/29/15 140/82  01/18/15 (!) 160/90  10/12/14 (!) 152/80    She was last seen for hypertension 8 months ago.  BP at that visit was 160/90. Management since that visit includes continue lisinopril and Metoprolol.She reports good compliance with treatment. She is not having side effects.  She is exercising lightly. She is adherent to low salt diet.   Outside blood pressures are not being checked. She is experiencing none.  Patient denies none.   Cardiovascular risk factors include advanced age (older than 4255 for men, 4765 for women) and dyslipidemia.  Use of agents associated with hypertension: none.   ------------------------------------------------------------------------    Lipid/Cholesterol, Follow-up:   Last seen for this 8 months ago.  Management since that visit includes continue Atorvastatin .  Last Lipid Panel:    Component Value Date/Time   CHOL 221 (H) 10/12/2014 1232   TRIG 281 (H) 10/12/2014 1232   HDL 66 10/12/2014 1232   CHOLHDL 3.3 10/12/2014 1232   LDLCALC 99 10/12/2014 1232    She reports good compliance with treatment. She is not having side effects.   Wt Readings from Last 3 Encounters:  11/29/15 158 lb (71.7 kg)  01/18/15 163 lb (73.9 kg)  10/12/14 162 lb (73.5 kg)    ------------------------------------------------------------------------    Previous Medications   ASA-APAP-CAFF BUFFERED (VANQUISH) [phone] MG TABS    Take  2 tablets by mouth 2 (two) times daily.   ATORVASTATIN (LIPITOR) 10 MG TABLET    TAKE ONE TABLET BY MOUTH AT BEDTIME   CALCIUM CARBONATE-VITAMIN D (CALTRATE 600+D) 600-400 MG-UNIT PER TABLET    Take by mouth.   COENZYME Q10 (COQ10 PO)    Take by mouth.   LISINOPRIL (PRINIVIL,ZESTRIL) 5 MG TABLET    Take 1 tablet (5 mg total) by mouth daily.   METOPROLOL TARTRATE (LOPRESSOR) 25 MG TABLET    Take 1 tablet (25 mg total) by mouth 2 (two) times daily.   MULTIPLE VITAMIN (MULTI-VITAMINS) TABS    Take by mouth.   NIACIN, ANTIHYPERLIPIDEMIC, 500 MG TABS    Take by mouth.    Review of Systems  Constitutional: Negative.   Respiratory: Negative.   Cardiovascular: Negative.   Gastrointestinal: Negative.   Neurological: Negative.   Psychiatric/Behavioral: Negative.     Social History  Substance Use Topics  . Smoking status: Former Smoker    Packs/day: 1.00    Years: 52.00    Quit date: 04/08/2000  . Smokeless tobacco: Never Used  . Alcohol use No   Objective:   BP 140/82 (BP Location: Right Arm, Patient Position: Sitting, Cuff Size: Normal)   Pulse 66   Temp 98.3 F (36.8 C) (Oral)   Resp 16   Wt 158 lb (71.7 kg)   BMI 27.99 kg/m   Physical Exam  Constitutional: She appears well-developed and well-nourished. No distress.  Cardiovascular: Normal rate, regular rhythm and normal heart sounds.  Exam reveals no gallop and  no friction rub.   No murmur heard. Pulmonary/Chest: Effort normal and breath sounds normal. No respiratory distress. She has no wheezes. She has no rales.  Musculoskeletal: She exhibits no edema.  Skin: She is not diaphoretic.  Vitals reviewed.     Assessment & Plan:     1. Essential hypertension Stable. Diagnosis pulled for medication refill. Continue current medical treatment plan. Patient requesting printed Rx since she is moving to [place] at the end of Sept with her nephew and his family. She is to call if she needs us before her move or anything after. -  lisinopril (PRINIVIL,ZESTRIL) 5 MG tablet; Take 1 tablet (5 mg total) by mouth daily.  Dispense: 90 tablet; Refill: 1 - metoprolol (LOPRESSOR) 50 MG tablet; Take 1 tablet (50 mg total) by mouth 2 (two) times daily.  Dispense: 180 tablet; Refill: 1  2. Hypercholesterolemia Stable. Diagnosis pulled for medication refill. Continue current medical treatment plan. See above.  - atorvastatin (LIPITOR) 10 MG tablet; Take 1 tablet (10 mg total) by mouth at bedtime.  Dispense: 90 tablet; Refill: 1   Follow up: No Follow-up on file.

## 2015-11-29 NOTE — Patient Instructions (Signed)
Hypertension Hypertension, commonly called high blood pressure, is when the force of blood pumping through your arteries is too strong. Your arteries are the blood vessels that carry blood from your heart throughout your body. A blood pressure reading consists of a higher number over a lower number, such as 110/72. The higher number (systolic) is the pressure inside your arteries when your heart pumps. The lower number (diastolic) is the pressure inside your arteries when your heart relaxes. Ideally you want your blood pressure below 120/80. Hypertension forces your heart to work harder to pump blood. Your arteries may become narrow or stiff. Having untreated or uncontrolled hypertension can cause heart attack, stroke, kidney disease, and other problems. RISK FACTORS Some risk factors for high blood pressure are controllable. Others are not.  Risk factors you cannot control include:   Race. You may be at higher risk if you are African American.  Age. Risk increases with age.  Gender. Men are at higher risk than women before age 45 years. After age 65, women are at higher risk than men. Risk factors you can control include:  Not getting enough exercise or physical activity.  Being overweight.  Getting too much fat, sugar, calories, or salt in your diet.  Drinking too much alcohol. SIGNS AND SYMPTOMS Hypertension does not usually cause signs or symptoms. Extremely high blood pressure (hypertensive crisis) may cause headache, anxiety, shortness of breath, and nosebleed. DIAGNOSIS To check if you have hypertension, your health care provider will measure your blood pressure while you are seated, with your arm held at the level of your heart. It should be measured at least twice using the same arm. Certain conditions can cause a difference in blood pressure between your right and left arms. A blood pressure reading that is higher than normal on one occasion does not mean that you need treatment. If  it is not clear whether you have high blood pressure, you may be asked to return on a different day to have your blood pressure checked again. Or, you may be asked to monitor your blood pressure at home for 1 or more weeks. TREATMENT Treating high blood pressure includes making lifestyle changes and possibly taking medicine. Living a healthy lifestyle can help lower high blood pressure. You may need to change some of your habits. Lifestyle changes may include:  Following the DASH diet. This diet is high in fruits, vegetables, and whole grains. It is low in salt, red meat, and added sugars.  Keep your sodium intake below 2,300 mg per day.  Getting at least 30-45 minutes of aerobic exercise at least 4 times per week.  Losing weight if necessary.  Not smoking.  Limiting alcoholic beverages.  Learning ways to reduce stress. Your health care provider may prescribe medicine if lifestyle changes are not enough to get your blood pressure under control, and if one of the following is true:  You are 18-59 years of age and your systolic blood pressure is above 140.  You are 60 years of age or older, and your systolic blood pressure is above 150.  Your diastolic blood pressure is above 90.  You have diabetes, and your systolic blood pressure is over 140 or your diastolic blood pressure is over 90.  You have kidney disease and your blood pressure is above 140/90.  You have heart disease and your blood pressure is above 140/90. Your personal target blood pressure may vary depending on your medical conditions, your age, and other factors. HOME CARE INSTRUCTIONS    Have your blood pressure rechecked as directed by your health care provider.   Take medicines only as directed by your health care provider. Follow the directions carefully. Blood pressure medicines must be taken as prescribed. The medicine does not work as well when you skip doses. Skipping doses also puts you at risk for  problems.  Do not smoke.   Monitor your blood pressure at home as directed by your health care provider. SEEK MEDICAL CARE IF:   You think you are having a reaction to medicines taken.  You have recurrent headaches or feel dizzy.  You have swelling in your ankles.  You have trouble with your vision. SEEK IMMEDIATE MEDICAL CARE IF:  You develop a severe headache or confusion.  You have unusual weakness, numbness, or feel faint.  You have severe chest or abdominal pain.  You vomit repeatedly.  You have trouble breathing. MAKE SURE YOU:   Understand these instructions.  Will watch your condition.  Will get help right away if you are not doing well or get worse.   This information is not intended to replace advice given to you by your health care provider. Make sure you discuss any questions you have with your health care provider.   Document Released: 03/26/2005 Document Revised: 08/10/2014 Document Reviewed: 01/16/2013 Elsevier Interactive Patient Education 2016 [employer]  

## 2016-01-11 ENCOUNTER — Other Ambulatory Visit: Payer: Self-pay | Admitting: Family Medicine

## 2016-01-11 DIAGNOSIS — I10 Essential (primary) hypertension: Secondary | ICD-10-CM

## 2016-01-11 NOTE — Telephone Encounter (Signed)
I see patient being on metoprolol 50mg , but 25mg  is being requested. Can we see which dose is accurate? Thanks.

## 2016-01-11 NOTE — Telephone Encounter (Signed)
LMTCB on both home phone and cell phone. Allene DillonEmily Drozdowski, CMA

## 2016-01-11 NOTE — Telephone Encounter (Signed)
Saw you 08/22/217. Allene DillonEmily Drozdowski, CMA

## 2016-01-13 ENCOUNTER — Telehealth: Payer: Self-pay | Admitting: Physician Assistant

## 2016-01-13 NOTE — Telephone Encounter (Signed)
Pt states she is taking 25mg  BID .  Thanks Barth Kirkseri

## 2016-01-13 NOTE — Telephone Encounter (Signed)
Pt stating she is returning Jenny Hill's call about her medicine. Please return pt's call @ [phone] Thanks CC

## 2016-01-13 NOTE — Telephone Encounter (Signed)
Please see other note on the medication refill. Patient spoke with Rosey Batheresa.  Thanks,  -Donnie Panik
